# Patient Record
Sex: Male | Born: 1985 | Race: White | Hispanic: No | Marital: Married
Health system: Southern US, Community
[De-identification: ages and names within clinical notes are randomized; demographics above are authoritative.]

---
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
## 2013-11-05 ENCOUNTER — Other Ambulatory Visit (HOSPITAL_COMMUNITY): Payer: Self-pay | Admitting: Physician Assistant

## 2013-11-05 DIAGNOSIS — R079 Chest pain, unspecified: Secondary | ICD-10-CM

## 2013-11-05 DIAGNOSIS — R109 Unspecified abdominal pain: Secondary | ICD-10-CM

## 2013-11-07 ENCOUNTER — Ambulatory Visit (HOSPITAL_COMMUNITY)
Admission: RE | Admit: 2013-11-07 | Discharge: 2013-11-07 | Disposition: A | Discharge status: Home / Self Care | Payer: BC Managed Care – PPO | Source: Ambulatory Visit | Attending: Physician Assistant | Admitting: Physician Assistant

## 2013-11-07 ENCOUNTER — Encounter (HOSPITAL_COMMUNITY): Payer: Self-pay

## 2013-11-07 DIAGNOSIS — R109 Unspecified abdominal pain: Secondary | ICD-10-CM | POA: Insufficient documentation

## 2013-11-07 DIAGNOSIS — R079 Chest pain, unspecified: Secondary | ICD-10-CM

## 2013-11-07 MED ORDER — IOHEXOL 300 MG/ML  SOLN
100.0000 mL | Freq: Once | INTRAMUSCULAR | Status: AC | PRN
  Administered 2013-11-07: 100 mL via INTRAVENOUS

## 2014-08-28 IMAGING — CT CT ABDOMEN W/ CM
2 of 7 series · 14 of 46 positions shown, 18 images · IV contrast (Omnipaque 300)
Comparison: None.

CLINICAL DATA: Left abdominal pain. Possible mass or palpable knot
for 2 months.

EXAM:
CT ABDOMEN WITH CONTRAST
TECHNIQUE: Multidetector CT imaging of the abdomen was performed using the
standard protocol following bolus administration of intravenous
contrast.
CONTRAST:  100mL OMNIPAQUE IOHEXOL 300 MG/ML SOLN. The technologist
reports that the patient sneezed during IV contrast administration
and was noted to have a left facial hive, but no itching. I was not
on site to evaluate the patient. This could reflect a mild contrast
allergy.

[Series 2: abd_with 5.0 b40f · axial · 0.72mm/px · z∈[-278,-18]mm · 11 of 60 slices shown, 15 images]
[im 4/60  soft-tissue]
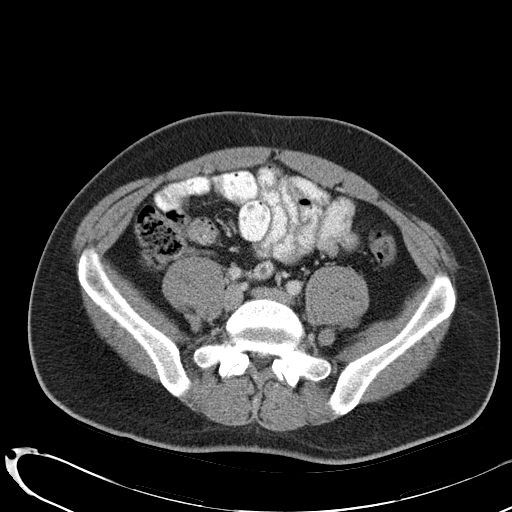
[im 4/60  bone]
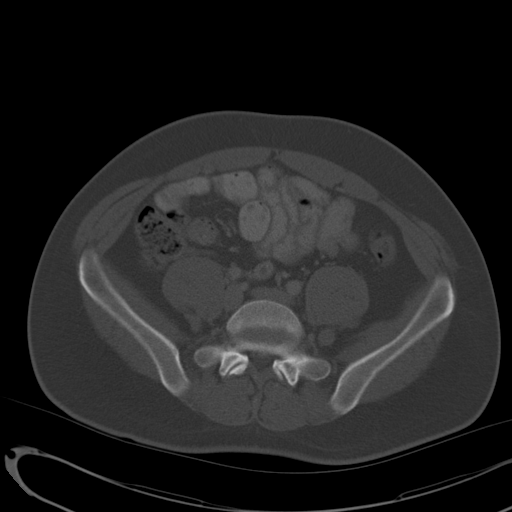
[im 12/60  soft-tissue]
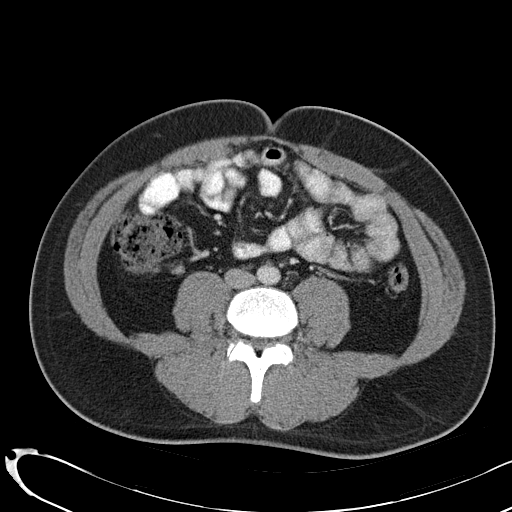
[im 19/60  soft-tissue]
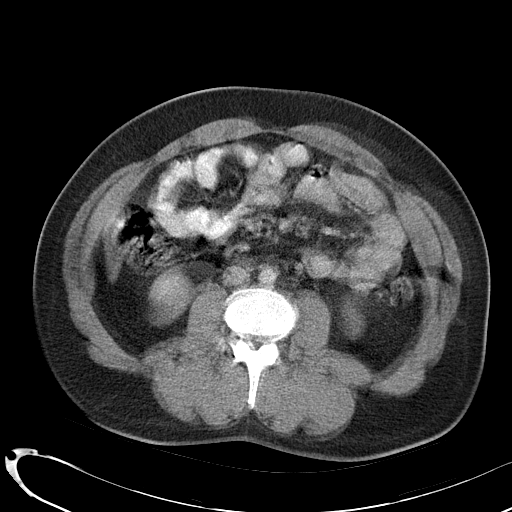
[im 23/60  soft-tissue]
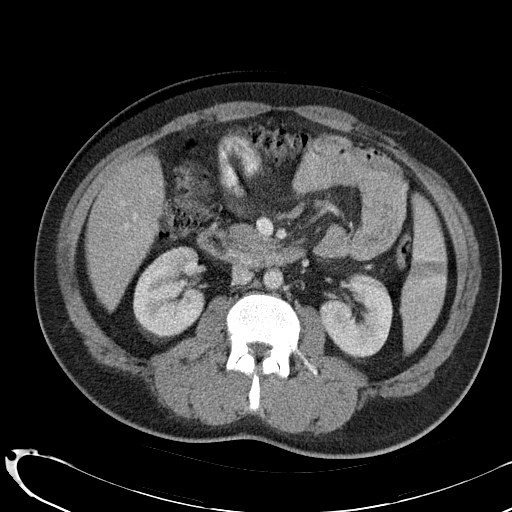
[im 30/60  soft-tissue]
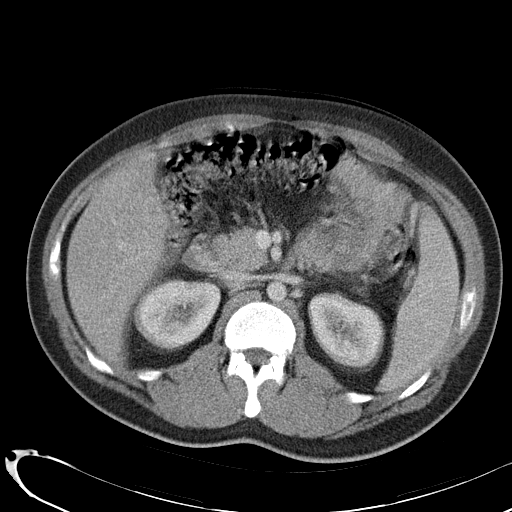
[im 37/60  soft-tissue]
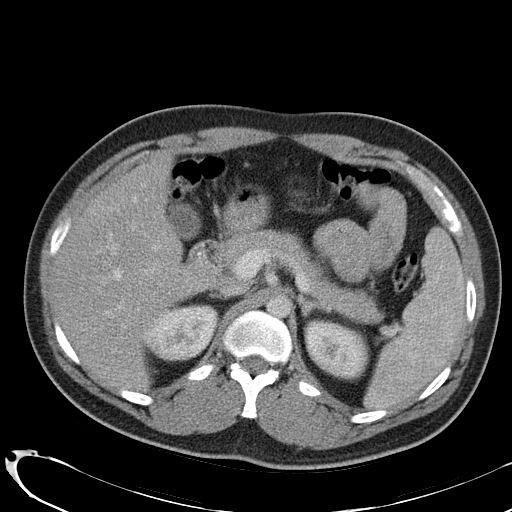
[im 41/60  soft-tissue]
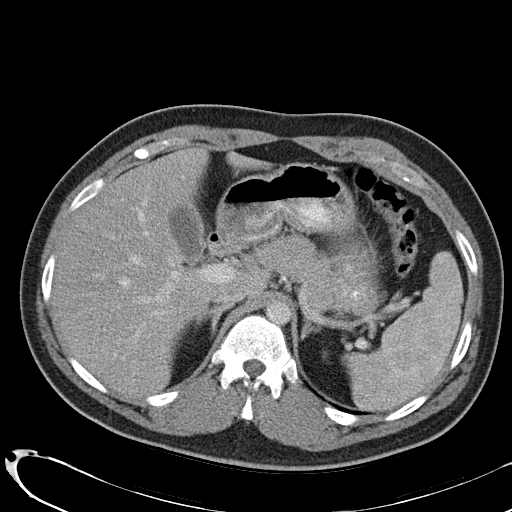
[im 45/60  lung]
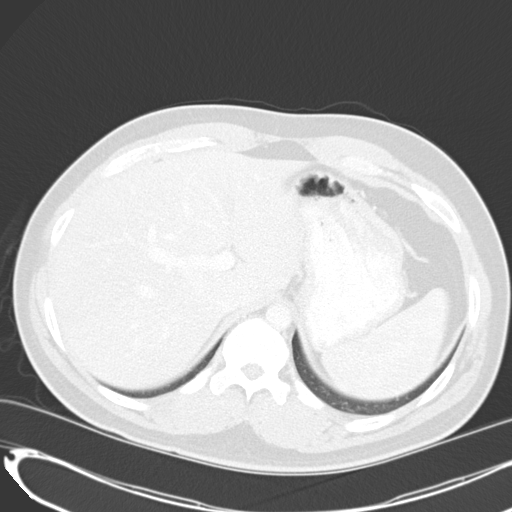
[im 48/60  soft-tissue]
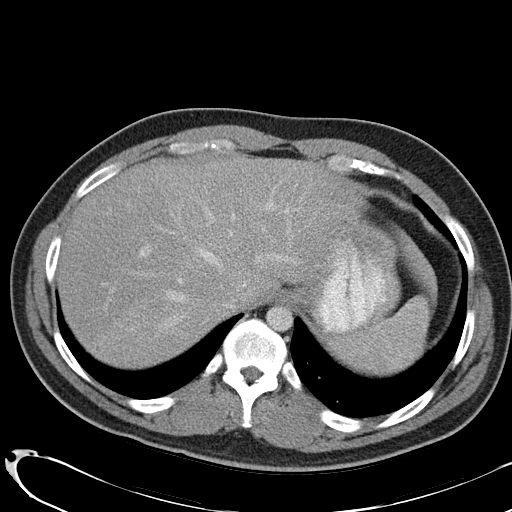
[im 48/60  lung]
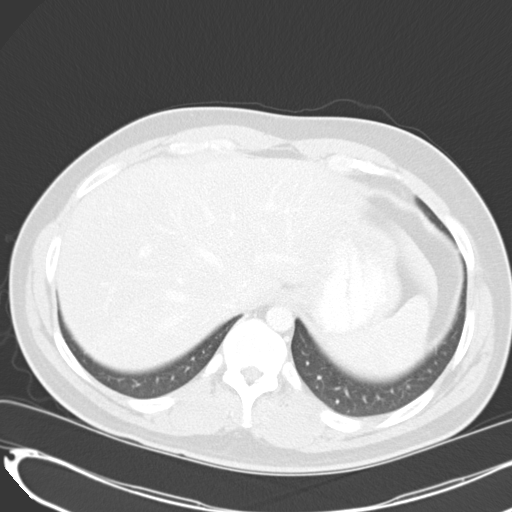
[im 52/60  lung]
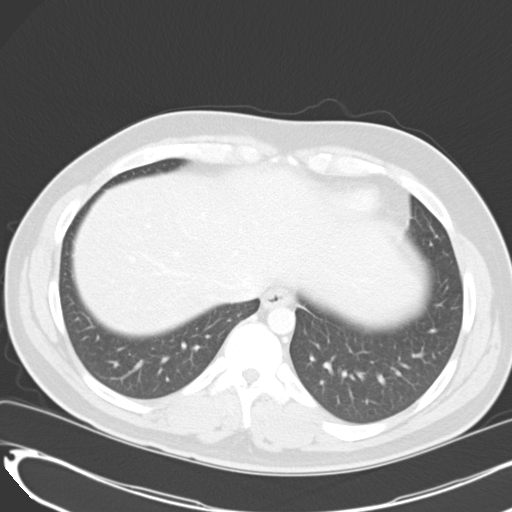
[im 56/60  soft-tissue]
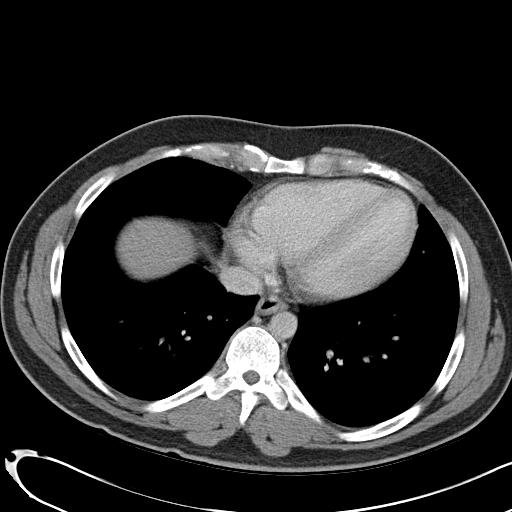
[im 56/60  lung]
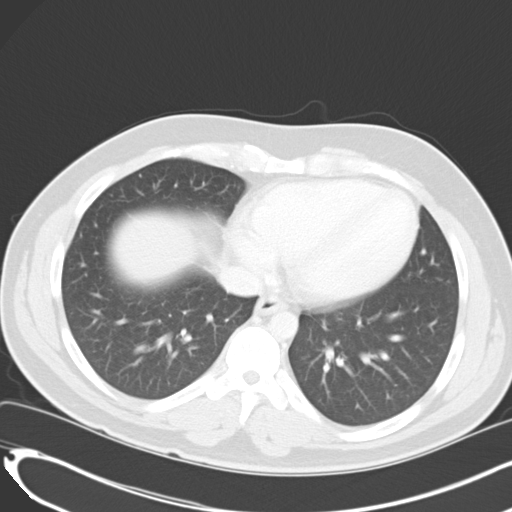
[im 56/60  bone]
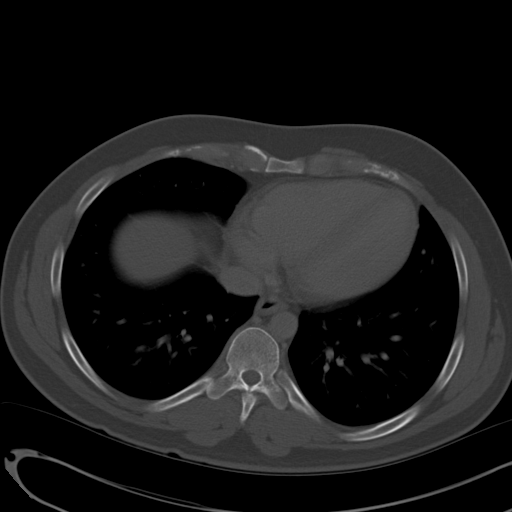

[Series 5: abd_with 3.0 spo · coronal · 0.59mm/px · 3 of 84 slices shown]
[im 21/84  soft-tissue]
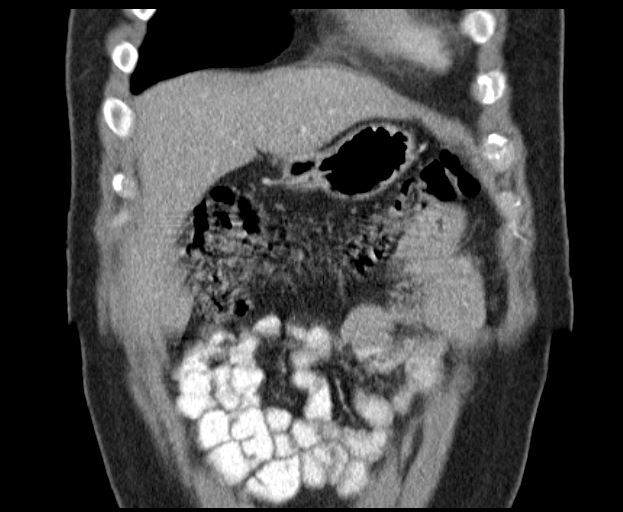
[im 42/84  soft-tissue]
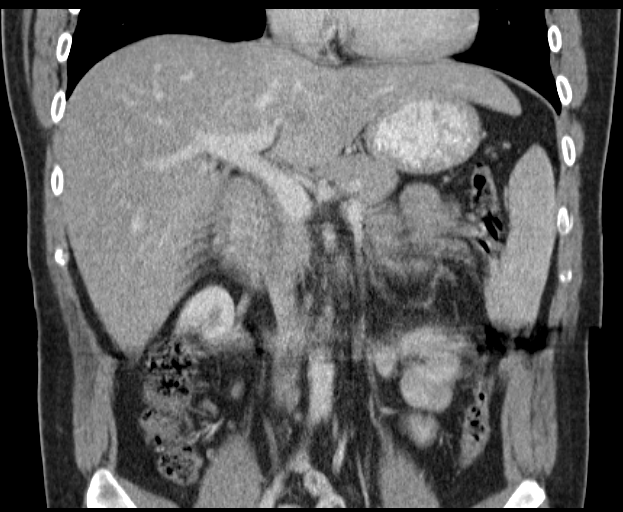
[im 63/84  soft-tissue]
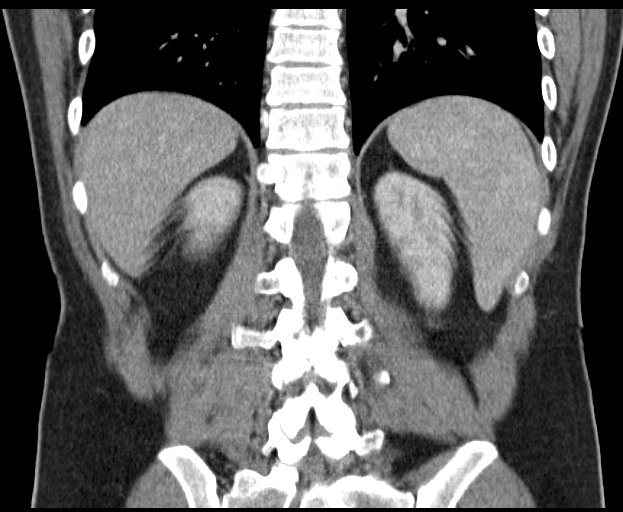

[14 of 46 positions shown; findings below may reference images not displayed]

FINDINGS: The visualized lung bases are clear. There is no significant pleural
or pericardial effusion.

Images through the upper abdomen demonstrate mild motion artifact.
There is no evidence of abdominal mass or hernia. No chest wall
lesions are identified.

The liver, gallbladder, spleen and pancreas appear normal. Both
adrenal glands and kidneys appear normal. There is no
hydronephrosis.

The stomach and visualized portions of the small bowel and colon
appear normal. No inflammatory changes are identified. The pelvis
was not imaged.
IMPRESSION: 1. No evidence of left upper quadrant abdominal mass or hernia.
2. No acute abdominal findings demonstrated.
3. Of note, patient exhibited symptoms following IV contrast
administration which may indicate a mild contrast allergy. This
should be considered prior to subsequent IV contrast administration.
